# Patient Record
Sex: Female | Born: 1965 | Race: White | Hispanic: No | Marital: Married | Smoking: Never smoker
Health system: Southern US, Community
[De-identification: ages and names within clinical notes are randomized; demographics above are authoritative.]

## PROBLEM LIST (undated history)

## (undated) DIAGNOSIS — J45909 Unspecified asthma, uncomplicated: Secondary | ICD-10-CM

## (undated) DIAGNOSIS — K219 Gastro-esophageal reflux disease without esophagitis: Secondary | ICD-10-CM

## (undated) DIAGNOSIS — A0472 Enterocolitis due to Clostridium difficile, not specified as recurrent: Secondary | ICD-10-CM

## (undated) DIAGNOSIS — F32A Depression, unspecified: Secondary | ICD-10-CM

## (undated) DIAGNOSIS — F329 Major depressive disorder, single episode, unspecified: Secondary | ICD-10-CM

## (undated) HISTORY — PX: CHOLECYSTECTOMY: SHX55

## (undated) HISTORY — PX: FOOT FASCIOTOMY: SHX1659

## (undated) HISTORY — DX: Depression, unspecified: F32.A

## (undated) HISTORY — DX: Unspecified asthma, uncomplicated: J45.909

## (undated) HISTORY — DX: Enterocolitis due to Clostridium difficile, not specified as recurrent: A04.72

---
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
## 2000-04-06 ENCOUNTER — Other Ambulatory Visit: Admission: RE | Admit: 2000-04-06 | Discharge: 2000-04-06 | Payer: Self-pay | Admitting: Gynecology

## 2002-06-26 ENCOUNTER — Other Ambulatory Visit: Admission: RE | Admit: 2002-06-26 | Discharge: 2002-06-26 | Payer: Self-pay | Admitting: Gynecology

## 2002-07-16 ENCOUNTER — Encounter: Admission: RE | Admit: 2002-07-16 | Discharge: 2002-07-16 | Payer: Self-pay | Admitting: Gynecology

## 2002-07-16 ENCOUNTER — Encounter: Payer: Self-pay | Admitting: Gynecology

## 2003-07-22 ENCOUNTER — Other Ambulatory Visit: Admission: RE | Admit: 2003-07-22 | Discharge: 2003-07-22 | Payer: Self-pay | Admitting: Gynecology

## 2003-10-13 ENCOUNTER — Other Ambulatory Visit: Admission: RE | Admit: 2003-10-13 | Discharge: 2003-10-13 | Payer: Self-pay | Admitting: Obstetrics and Gynecology

## 2004-04-19 ENCOUNTER — Inpatient Hospital Stay (HOSPITAL_COMMUNITY): Admission: AD | Admit: 2004-04-19 | Discharge: 2004-04-22 | Payer: Self-pay | Admitting: Obstetrics and Gynecology

## 2004-06-10 ENCOUNTER — Other Ambulatory Visit: Admission: RE | Admit: 2004-06-10 | Discharge: 2004-06-10 | Payer: Self-pay | Admitting: Obstetrics and Gynecology

## 2005-07-21 ENCOUNTER — Other Ambulatory Visit: Admission: RE | Admit: 2005-07-21 | Discharge: 2005-07-21 | Payer: Self-pay | Admitting: Gynecology

## 2006-07-02 ENCOUNTER — Inpatient Hospital Stay (HOSPITAL_COMMUNITY): Admission: AD | Admit: 2006-07-02 | Discharge: 2006-07-05 | Payer: Self-pay | Admitting: Obstetrics and Gynecology

## 2007-03-14 ENCOUNTER — Encounter: Admission: RE | Admit: 2007-03-14 | Discharge: 2007-03-14 | Payer: Self-pay | Admitting: Obstetrics and Gynecology

## 2007-03-14 IMAGING — MG MM DIGITAL SCREENING
4 series · 4 of 4 positions shown · non-contrast
Comparison: Prior studies.

DG SCREEN MAMMOGRAM BILATERAL
Bilateral CC and MLO view(s) were taken.
Technologist: FEI
Prior study comparison: [DATE], bilateral screening mammogram, performed at The [REDACTED].

DIGITAL SCREENING MAMMOGRAM WITH CAD:

[R CC]
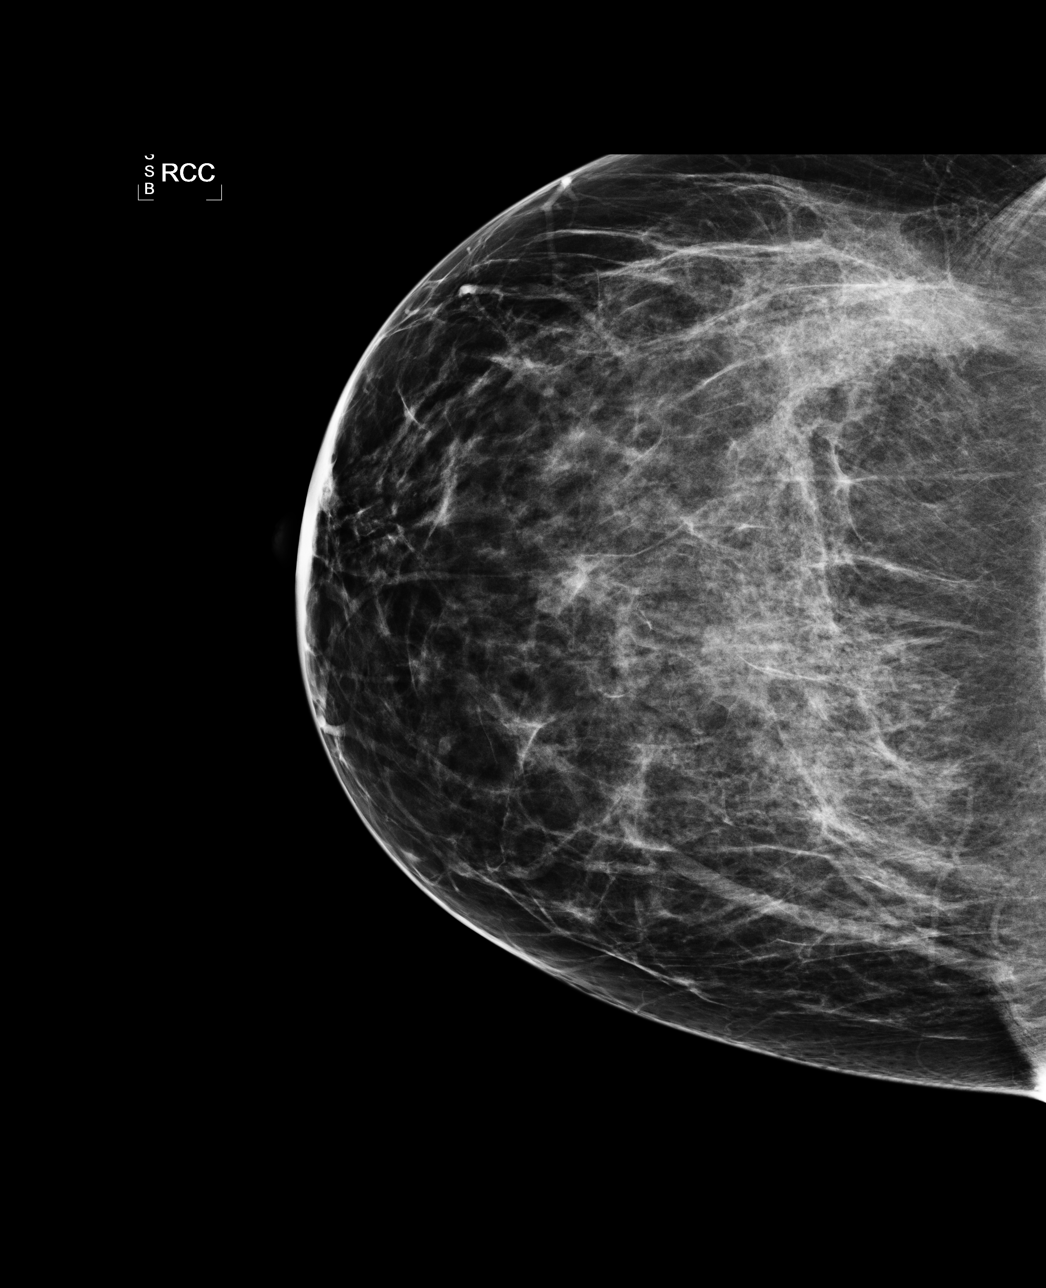

[L CC]
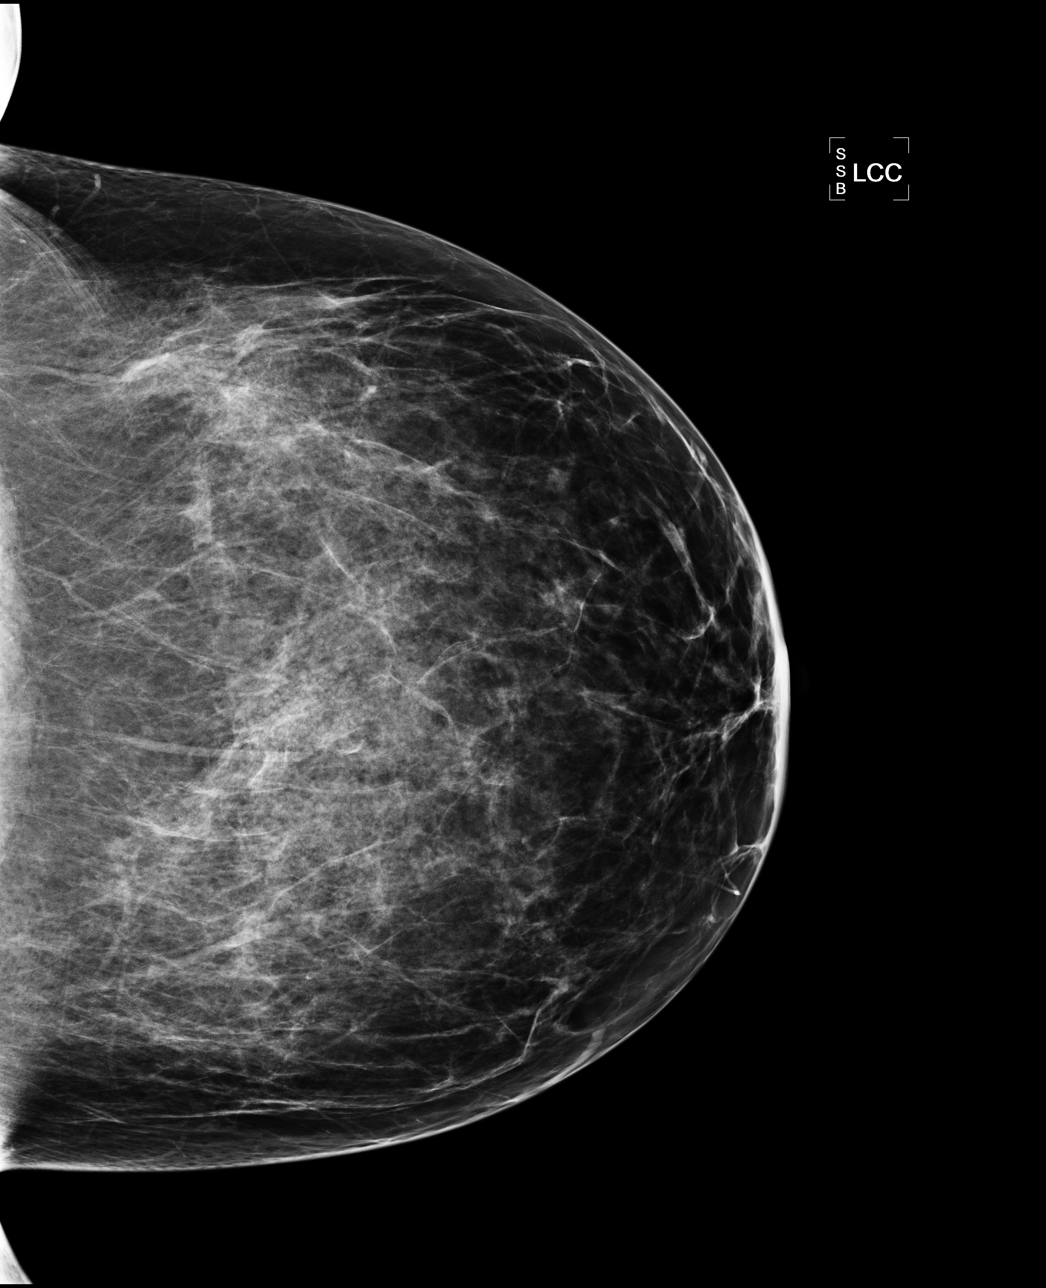

[L MLO]
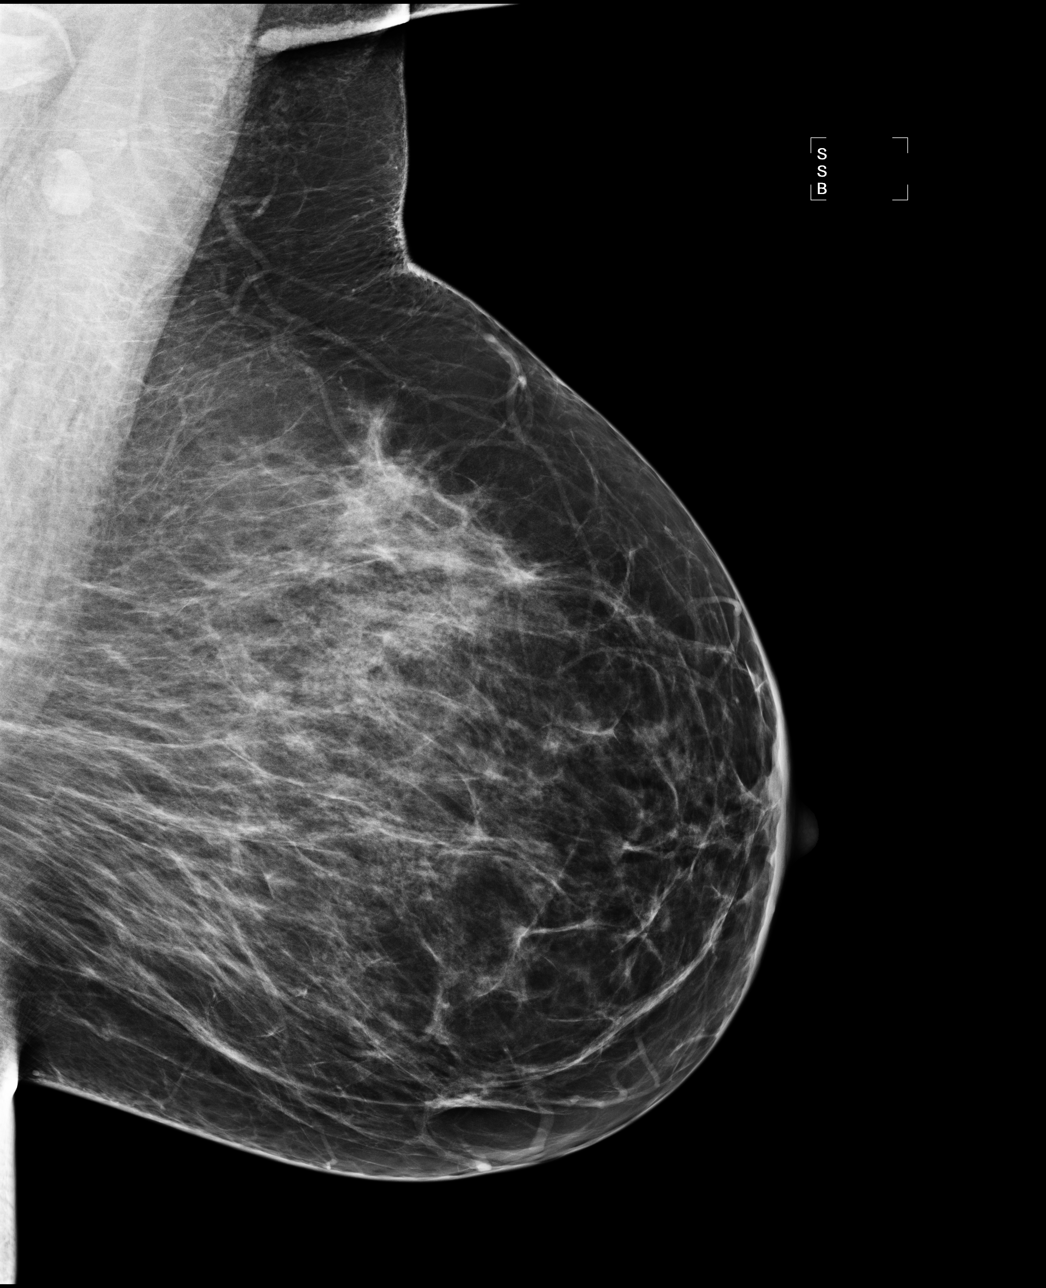

[R MLO]
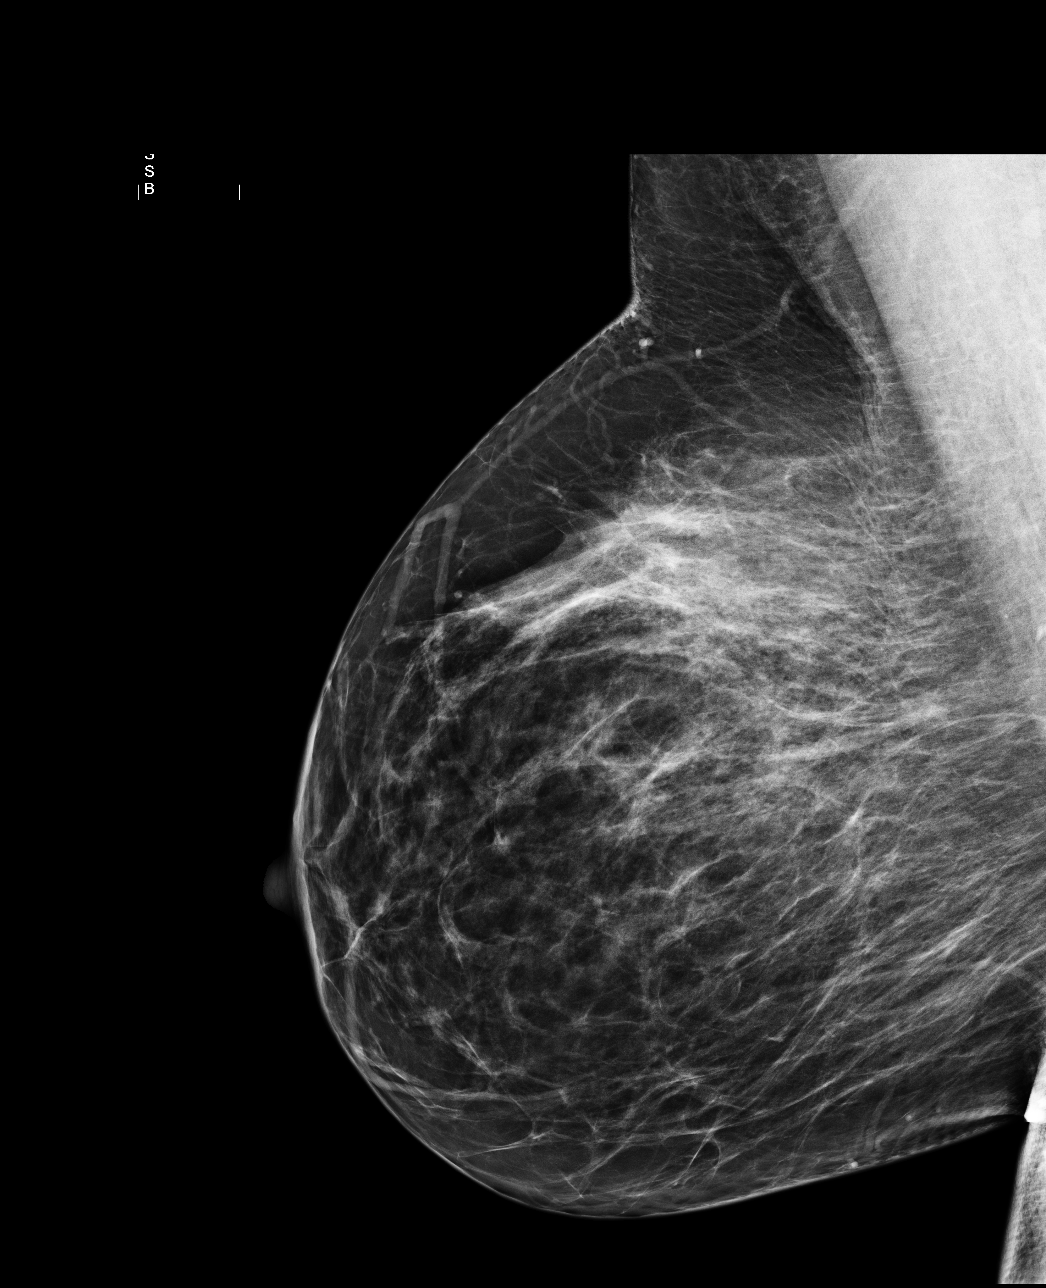

[4 of 4 positions shown; findings below may reference images not displayed]

There are scattered fibroglandular densities.  There is no dominant mass, architectural distortion 
or calcification to suggest malignancy.
IMPRESSION: No mammographic evidence of malignancy.  Suggest yearly screening mammography.

ASSESSMENT: Negative - BI-RADS 1

Screening mammogram of both breasts in 1 year.
THIS WAS ANALAYZED BY COMPUTER AIDED DETECTION. , THIS PROCEDURE WAS A DIGITAL MAMMOGRAM.

## 2008-06-30 ENCOUNTER — Ambulatory Visit: Payer: Self-pay | Admitting: Occupational Medicine

## 2009-06-08 ENCOUNTER — Encounter (INDEPENDENT_AMBULATORY_CARE_PROVIDER_SITE_OTHER): Payer: Self-pay

## 2009-06-09 ENCOUNTER — Ambulatory Visit: Payer: Self-pay | Admitting: Gastroenterology

## 2009-06-11 ENCOUNTER — Telehealth: Payer: Self-pay | Admitting: Gastroenterology

## 2009-06-23 ENCOUNTER — Ambulatory Visit: Payer: Self-pay | Admitting: Gastroenterology

## 2009-06-23 HISTORY — PX: COLONOSCOPY: SHX174

## 2010-05-25 NOTE — Miscellaneous (Signed)
Summary: Lec previsit  Clinical Lists Changes  Medications: Added new medication of MOVIPREP 100 GM  SOLR (PEG-KCL-NACL-NASULF-NA ASC-C) As per prep instructions. - Signed Rx of MOVIPREP 100 GM  SOLR (PEG-KCL-NACL-NASULF-NA ASC-C) As per prep instructions.;  #1 x 0;  Signed;  Entered by: Ulis Rias RN;  Authorized by: Louis Meckel MD;  Method used: Electronically to [facility]*, [address] [place], [place]  [address], Ph: [phone], Fax: [phone] Observations: Added new observation of ALLERGY REV: Done (06/09/2009 12:47)    Prescriptions: MOVIPREP 100 GM  SOLR (PEG-KCL-NACL-NASULF-NA ASC-C) As per prep instructions.  #1 x 0   Entered by:   Ulis Rias RN   Authorized by:   Louis Meckel MD   Signed by:   Ulis Rias RN on 06/09/2009   Method used:   Electronically to        [facility]* (retail)       [address] [place], [place]  [address]       Ph: [phone]       Fax: [phone]   RxID:   [id number]

## 2010-05-25 NOTE — Letter (Signed)
Summary: [facility] Instructions  [facility]  [address] [place], [place] [address]   Phone: [phone]  Fax: [phone]       Kristina White    [birth year]    MRN: [id number]        Procedure Day /Date:  06/23/09     Arrival Time: 12:30pm     Procedure Time:  1:30pm     Location of Procedure:                    _ x_  [facility] (4th Floor)                        PREPARATION FOR COLONOSCOPY WITH MOVIPREP   Starting 5 days prior to your procedure _ 2/24/11_ do not eat nuts, seeds, popcorn, corn, beans, peas,  salads, or any raw vegetables.  Do not take any fiber supplements (e.g. Metamucil, Citrucel, and Benefiber).  THE DAY BEFORE YOUR PROCEDURE         DATE:  06/22/09   DAY:   Monday  1.  Drink clear liquids the entire day-NO SOLID FOOD  2.  Do not drink anything colored red or purple.  Avoid juices with pulp.  No orange juice.  3.  Drink at least 64 oz. (8 glasses) of fluid/clear liquids during the day to prevent dehydration and help the prep work efficiently.  CLEAR LIQUIDS INCLUDE: Water Jello Ice Popsicles Tea (sugar ok, no milk/cream) Powdered fruit flavored drinks Coffee (sugar ok, no milk/cream) Gatorade Juice: apple, white grape, white cranberry  Lemonade Clear bullion, consomm, broth Carbonated beverages (any kind) Strained chicken noodle soup Hard Candy                             4.  In the morning, mix first dose of MoviPrep solution:    Empty 1 Pouch A and 1 Pouch B into the disposable container    Add lukewarm drinking water to the top line of the container. Mix to dissolve    Refrigerate (mixed solution should be used within 24 hrs)  5.  Begin drinking the prep at 5:00 p.m. The MoviPrep container is divided by 4 marks.   Every 15 minutes drink the solution down to the next mark (approximately 8 oz) until the full liter is complete.   6.  Follow completed prep with 16 oz of clear liquid of your choice  (Nothing red or purple).  Continue to drink clear liquids until bedtime.  7.  Before going to bed, mix second dose of MoviPrep solution:    Empty 1 Pouch A and 1 Pouch B into the disposable container    Add lukewarm drinking water to the top line of the container. Mix to dissolve    Refrigerate  THE DAY OF YOUR PROCEDURE      DATE:  06/23/09  DAY:  Tuesday  Beginning at  8:30am  (5 hours before procedure):         1. Every 15 minutes, drink the solution down to the next mark (approx 8 oz) until the full liter is complete.  2. Follow completed prep with 16 oz. of clear liquid of your choice.    3. You may drink clear liquids until  11:30am  (2 HOURS BEFORE PROCEDURE).   MEDICATION INSTRUCTIONS  Unless otherwise instructed, you should take regular prescription medications with a small sip of water  as early as possible the morning of your procedure.         OTHER INSTRUCTIONS  You will need a responsible adult at least 44 years of age to accompany you and drive you home.   This person must remain in the waiting room during your procedure.  Wear loose fitting clothing that is easily removed.  Leave jewelry and other valuables at home.  However, you may wish to bring a book to read or  an iPod/MP3 player to listen to music as you wait for your procedure to start.  Remove all body piercing jewelry and leave at home.  Total time from sign-in until discharge is approximately 2-3 hours.  You should go home directly after your procedure and rest.  You can resume normal activities the  day after your procedure.  The day of your procedure you should not:   Drive   Make legal decisions   Operate machinery   Drink alcohol   Return to work  You will receive specific instructions about eating, activities and medications before you leave.    The above instructions have been reviewed and explained to me by   Ulis Rias RN  June 09, 2009 1:01 PM    I fully  understand and can verbalize these instructions _____________________________ Date _________

## 2010-05-25 NOTE — Progress Notes (Signed)
Summary: triage  Phone Note From Pharmacy   Caller: [facility] [address] Summary of Call: Patient does not want to pay 50.00 for Moviprep.  She wants to know if she can have cheaper prep-Miralax???  Can you call [facility] at [phone] and advise.  Thanks.....  I tried to call patient to see if she can pick up 20.00 rebate coupon but no answer.   Initial call taken by: Milford Cage [place],  June 11, 2009 3:04 PM  Follow-up for Phone Call        Message left for pt. to pick-up rebate coupon. Pt. instructed to call back as needed.  Follow-up by: Laureen Ochs LPN,  June 11, 2009 3:28 PM

## 2010-05-25 NOTE — Procedures (Signed)
Summary: Colonoscopy  Patient: Kristina White Note: All result statuses are Final unless otherwise noted.  Tests: (1) Colonoscopy (COL)   COL Colonoscopy           DONE     [facility]     [address]     [place], [place]  [address]           COLONOSCOPY PROCEDURE REPORT           PATIENT:  Kristina, White  MR#:  [id number]     BIRTHDATE:  1966/01/20, 44 yrs. old  GENDER:  female           ENDOSCOPIST:  Barbette Hair. Arlyce Dice, MD     Referred by:           PROCEDURE DATE:  06/23/2009     PROCEDURE:  Colonoscopy, Diagnostic     ASA CLASS:  Class II     INDICATIONS:  Routine Risk Screening, family Hx of polyps, family     history of colon cancer           MEDICATIONS:   Fentanyl 100 mcg IV, Versed 9 mg IV           DESCRIPTION OF PROCEDURE:   After the risks benefits and     alternatives of the procedure were thoroughly explained, informed     consent was obtained.  Digital rectal exam was performed and     revealed no abnormalities.   The LB CF-[id number] endoscope     was introduced through the anus and advanced to the cecum, which     was identified by both the appendix and ileocecal valve, without     limitations.  The quality of the prep was good, using MoviPrep.     The instrument was then slowly withdrawn as the colon was fully     examined.     <<PROCEDUREIMAGES>>           FINDINGS:  A normal appearing cecum, ileocecal valve, and     appendiceal orifice were identified. The ascending, hepatic     flexure, transverse, splenic flexure, descending, sigmoid colon,     and rectum appeared unremarkable (see image2, image3, image4,     image6, image7, image9, image10, image11, and image12).     Retroflexed views in the rectum revealed no abnormalities.    The     scope was then withdrawn from the patient and the procedure     completed.           COMPLICATIONS:  None           ENDOSCOPIC IMPRESSION:     1) Normal colon     RECOMMENDATIONS:     1)  colonoscopy in 10 years           REPEAT EXAM:  In 10 year(s) for Colonoscopy.           ______________________________     Barbette Hair. Arlyce Dice, MD           CC:  Beather Arbour, MD           n.     Rosalie Doctor:   Barbette Hair. Kaplan at 06/23/2009 02:14 PM           Ambrose Pancoast, [id number]  Note: An exclamation mark (!) indicates a result that was not dispersed into the flowsheet. Document Creation Date: 06/23/2009 2:14 PM _______________________________________________________________________  (1) Order result status: Final Collection or observation date-time: 06/23/2009 14:09 Requested  date-time:  Receipt date-time:  Reported date-time:  Referring Physician:   Ordering Physician: Melvia Heaps [id number]) Specimen Source:  Source: Launa Grill Order Number: [id number] Lab site:   Appended Document: Colonoscopy    Clinical Lists Changes  Observations: Added new observation of COLONNXTDUE: 06/2019 (06/23/2009 14:59)

## 2010-11-05 ENCOUNTER — Other Ambulatory Visit: Payer: Self-pay | Admitting: Gynecology

## 2010-11-05 DIAGNOSIS — Z1231 Encounter for screening mammogram for malignant neoplasm of breast: Secondary | ICD-10-CM

## 2010-11-09 ENCOUNTER — Ambulatory Visit
Admission: RE | Admit: 2010-11-09 | Discharge: 2010-11-09 | Disposition: A | Discharge status: Home / Self Care | Payer: Self-pay | Source: Ambulatory Visit | Attending: Gynecology | Admitting: Gynecology

## 2010-11-09 DIAGNOSIS — Z1231 Encounter for screening mammogram for malignant neoplasm of breast: Secondary | ICD-10-CM

## 2011-11-16 ENCOUNTER — Other Ambulatory Visit: Payer: Self-pay | Admitting: Gynecology

## 2011-11-16 DIAGNOSIS — Z9289 Personal history of other medical treatment: Secondary | ICD-10-CM

## 2011-11-22 ENCOUNTER — Ambulatory Visit: Payer: BC Managed Care – PPO

## 2011-11-29 ENCOUNTER — Ambulatory Visit: Payer: BC Managed Care – PPO

## 2012-01-03 ENCOUNTER — Ambulatory Visit (INDEPENDENT_AMBULATORY_CARE_PROVIDER_SITE_OTHER): Payer: BC Managed Care – PPO

## 2012-01-03 DIAGNOSIS — Z1231 Encounter for screening mammogram for malignant neoplasm of breast: Secondary | ICD-10-CM

## 2013-11-29 ENCOUNTER — Encounter: Payer: Self-pay | Admitting: Gastroenterology

## 2014-01-06 DIAGNOSIS — B001 Herpesviral vesicular dermatitis: Secondary | ICD-10-CM | POA: Insufficient documentation

## 2014-01-06 DIAGNOSIS — S73191A Other sprain of right hip, initial encounter: Secondary | ICD-10-CM | POA: Insufficient documentation

## 2014-01-06 DIAGNOSIS — F329 Major depressive disorder, single episode, unspecified: Secondary | ICD-10-CM | POA: Insufficient documentation

## 2014-01-06 DIAGNOSIS — M5136 Other intervertebral disc degeneration, lumbar region: Secondary | ICD-10-CM | POA: Insufficient documentation

## 2017-06-16 DIAGNOSIS — M79642 Pain in left hand: Secondary | ICD-10-CM | POA: Insufficient documentation

## 2017-12-27 DIAGNOSIS — M159 Polyosteoarthritis, unspecified: Secondary | ICD-10-CM | POA: Insufficient documentation

## 2018-09-21 ENCOUNTER — Other Ambulatory Visit: Payer: Self-pay | Admitting: Obstetrics and Gynecology

## 2018-09-21 DIAGNOSIS — Z803 Family history of malignant neoplasm of breast: Secondary | ICD-10-CM

## 2018-09-21 DIAGNOSIS — Z9189 Other specified personal risk factors, not elsewhere classified: Secondary | ICD-10-CM

## 2018-10-05 ENCOUNTER — Encounter: Payer: Self-pay | Admitting: Obstetrics and Gynecology

## 2019-05-29 ENCOUNTER — Encounter: Payer: Self-pay | Admitting: Gastroenterology

## 2019-06-11 ENCOUNTER — Other Ambulatory Visit: Payer: Self-pay

## 2019-06-11 ENCOUNTER — Ambulatory Visit (AMBULATORY_SURGERY_CENTER): Payer: BC Managed Care – PPO | Admitting: *Deleted

## 2019-06-11 VITALS — Temp 97.3°F | Ht 62.5 in | Wt 192.0 lb

## 2019-06-11 DIAGNOSIS — Z1211 Encounter for screening for malignant neoplasm of colon: Secondary | ICD-10-CM

## 2019-06-11 HISTORY — PX: CERVICAL BIOPSY: SHX590

## 2019-06-11 MED ORDER — NA SULFATE-K SULFATE-MG SULF 17.5-3.13-1.6 GM/177ML PO SOLN: ORAL | 0 refills | Status: DC

## 2019-06-11 NOTE — Progress Notes (Signed)
Patient is here in-person for PV. Patient denies any allergies to eggs or soy. Patient denies any problems with anesthesia/sedation. Patient denies any oxygen use at home. Patient denies taking any diet/weight loss medications or blood thinners. Patient is not being treated for MRSA or C-diff. EMMI education assisgned to the patient for the procedure, this was explained and instructions given to patient. COVID-19 screening test not needed patient had + covid on 04/22/2019.   Pt is having freq. Daily dark Soft stools, since covid +. Pt is aware that care partner will wait in the car during procedure; if they feel like they will be too hot or cold to wait in the car; they may wait in the 4 th floor lobby. Patient is aware to bring only one care partner. We want them to wear a mask (we do not have any that we can provide them), practice social distancing, and we will check their temperatures when they get here.  I did remind the patient that their care partner needs to stay in the parking lot the entire time and have a cell phone available, we will call them when the pt is ready for discharge. Patient will wear mask into building.    Suprep $15 off coupon given to the patient.

## 2019-06-21 ENCOUNTER — Encounter: Payer: Self-pay | Admitting: Gastroenterology

## 2019-06-25 ENCOUNTER — Encounter: Payer: Self-pay | Admitting: Gastroenterology

## 2019-06-25 ENCOUNTER — Other Ambulatory Visit: Payer: Self-pay

## 2019-06-25 ENCOUNTER — Ambulatory Visit (AMBULATORY_SURGERY_CENTER): Payer: BC Managed Care – PPO | Admitting: Gastroenterology

## 2019-06-25 VITALS — BP 147/85 | HR 66 | Temp 96.9°F | Resp 15 | Ht 62.5 in | Wt 192.0 lb

## 2019-06-25 DIAGNOSIS — Z1211 Encounter for screening for malignant neoplasm of colon: Secondary | ICD-10-CM | POA: Diagnosis present

## 2019-06-25 DIAGNOSIS — D120 Benign neoplasm of cecum: Secondary | ICD-10-CM

## 2019-06-25 DIAGNOSIS — D128 Benign neoplasm of rectum: Secondary | ICD-10-CM | POA: Diagnosis not present

## 2019-06-25 DIAGNOSIS — D125 Benign neoplasm of sigmoid colon: Secondary | ICD-10-CM | POA: Diagnosis not present

## 2019-06-25 MED ORDER — SODIUM CHLORIDE 0.9 % IV SOLN: 500.0000 mL | Freq: Once | INTRAVENOUS | Status: DC

## 2019-06-25 NOTE — Progress Notes (Signed)
pt tolerated well. VSS. awake and to recovery. Report given to RN.  

## 2019-06-25 NOTE — Progress Notes (Signed)
Called to room to assist during endoscopic procedure.  Patient ID and intended procedure confirmed with present staff. Received instructions for my participation in the procedure from the performing physician.  

## 2019-06-25 NOTE — Progress Notes (Signed)
Vs by Shanon Payor front desk [facility]  Pt's states no medical or surgical changes since previsit or office visit.

## 2019-06-25 NOTE — Patient Instructions (Signed)
Handouts Provided:  Polyps, Diverticulosis, and Hemorrhoids  YOU HAD AN ENDOSCOPIC PROCEDURE TODAY AT [facility]:   Refer to the procedure report that was given to you for any specific questions about what was found during the examination.  If the procedure report does not answer your questions, please call your gastroenterologist to clarify.  If you requested that your care partner not be given the details of your procedure findings, then the procedure report has been included in a sealed envelope for you to review at your convenience later.  YOU SHOULD EXPECT: Some feelings of bloating in the abdomen. Passage of more gas than usual.  Walking can help get rid of the air that was put into your GI tract during the procedure and reduce the bloating. If you had a lower endoscopy (such as a colonoscopy or flexible sigmoidoscopy) you may notice spotting of blood in your stool or on the toilet paper. If you underwent a bowel prep for your procedure, you may not have a normal bowel movement for a few days.  Please Note:  You might notice some irritation and congestion in your nose or some drainage.  This is from the oxygen used during your procedure.  There is no need for concern and it should clear up in a day or so.  SYMPTOMS TO REPORT IMMEDIATELY:   Following lower endoscopy (colonoscopy or flexible sigmoidoscopy):  Excessive amounts of blood in the stool  Significant tenderness or worsening of abdominal pains  Swelling of the abdomen that is new, acute  Fever of 100F or higher  For urgent or emergent issues, a gastroenterologist can be reached at any hour by calling [phone].   DIET:  We do recommend a small meal at first, but then you may proceed to your regular diet.  Drink plenty of fluids but you should avoid alcoholic beverages for 24 hours.  ACTIVITY:  You should plan to take it easy for the rest of today and you should NOT DRIVE or use heavy machinery until tomorrow  (because of the sedation medicines used during the test).    FOLLOW UP: Our staff will call the number listed on your records 48-72 hours following your procedure to check on you and address any questions or concerns that you may have regarding the information given to you following your procedure. If we do not reach you, we will leave a message.  We will attempt to reach you two times.  During this call, we will ask if you have developed any symptoms of COVID 19. If you develop any symptoms (ie: fever, flu-like symptoms, shortness of breath, cough etc.) before then, please call [phone].  If you test positive for Covid 19 in the 2 weeks post procedure, please call and report this information to [place].    If any biopsies were taken you will be contacted by phone or by letter within the next 1-3 weeks.  Please call us at [phone] if you have not heard about the biopsies in 3 weeks.    SIGNATURES/CONFIDENTIALITY: You and/or your care partner have signed paperwork which will be entered into your electronic medical record.  These signatures attest to the fact that that the information above on your After Visit Summary has been reviewed and is understood.  Full responsibility of the confidentiality of this discharge information lies with you and/or your care-partner.

## 2019-06-25 NOTE — Op Note (Signed)
[facility] Patient Name: Kristina White Procedure Date: 06/25/2019 9:10 AM MRN: [id number] Endoscopist: Mauri Pole , MD Age: 54 Referring MD:  Date of Birth: 01/11/1966 Gender: Female Account #: [id number] Procedure:                Colonoscopy Indications:              Screening for colorectal malignant neoplasm.                            Grandparent with colon cancer and father had colon                            polyps. Medicines:                Monitored Anesthesia Care Procedure:                Pre-Anesthesia Assessment:                           - Prior to the procedure, a History and Physical                            was performed, and patient medications and                            allergies were reviewed. The patient's tolerance of                            previous anesthesia was also reviewed. The risks                            and benefits of the procedure and the sedation                            options and risks were discussed with the patient.                            All questions were answered, and informed consent                            was obtained. Prior Anticoagulants: The patient has                            taken no previous anticoagulant or antiplatelet                            agents. ASA Grade Assessment: II - A patient with                            mild systemic disease. After reviewing the risks                            and benefits, the patient was deemed in  satisfactory condition to undergo the procedure.                           After obtaining informed consent, the colonoscope                            was passed under direct vision. Throughout the                            procedure, the patient's blood pressure, pulse, and                            oxygen saturations were monitored continuously. The                            Colonoscope was introduced through the anus and                             advanced to the the cecum, identified by                            appendiceal orifice and ileocecal valve. The                            colonoscopy was performed without difficulty. The                            patient tolerated the procedure well. The quality                            of the bowel preparation was excellent. The                            ileocecal valve, appendiceal orifice, and rectum                            were photographed. Scope In: 9:14:30 AM Scope Out: 9:34:34 AM Scope Withdrawal Time: 0 hours 14 minutes 23 seconds  Total Procedure Duration: 0 hours 20 minutes 4 seconds  Findings:                 The perianal and digital rectal examinations were                            normal.                           A 4 mm polyp was found in the sigmoid colon. The                            polyp was sessile. The polyp was removed with a                            cold snare. Resection and retrieval were complete.  Three sessile polyps were found in the rectum and                            cecum. The polyps were 1 to 2 mm in size. These                            polyps were removed with a cold biopsy forceps.                            Resection and retrieval were complete.                           A few small-mouthed diverticula were found in the                            sigmoid colon.                           Non-bleeding internal hemorrhoids were found during                            retroflexion. The hemorrhoids were small.                           The exam was otherwise without abnormality. Complications:            No immediate complications. Estimated Blood Loss:     Estimated blood loss was minimal. Impression:               - One 4 mm polyp in the sigmoid colon, removed with                            a cold snare. Resected and retrieved.                           - Three 1 to 2 mm polyps in  the rectum and in the                            cecum, removed with a cold biopsy forceps. Resected                            and retrieved.                           - Diverticulosis in the sigmoid colon.                           - Non-bleeding internal hemorrhoids.                           - The examination was otherwise normal. Recommendation:           - Patient has a contact number available for  emergencies. The signs and symptoms of potential                            delayed complications were discussed with the                            patient. Return to normal activities tomorrow.                            Written discharge instructions were provided to the                            patient.                           - Resume previous diet.                           - Continue present medications.                           - Await pathology results.                           - Repeat colonoscopy in 3 - 10 years for                            surveillance based on pathology results. Mauri Pole, MD 06/25/2019 9:41:40 AM This report has been signed electronically.

## 2019-06-27 ENCOUNTER — Telehealth: Payer: Self-pay

## 2019-06-27 ENCOUNTER — Encounter: Payer: Self-pay | Admitting: Gastroenterology

## 2019-06-27 NOTE — Telephone Encounter (Signed)
Left message on follow up call. 

## 2019-06-27 NOTE — Telephone Encounter (Signed)
  Follow up Call-  Call back number 06/25/2019  Post procedure Call Back phone  # [phone]  Permission to leave phone message Yes  Some recent data might be hidden     Patient questions:  Do you have a fever, pain , or abdominal swelling? No. Pain Score  0 *  Have you tolerated food without any problems? Yes.    Have you been able to return to your normal activities? Yes.    Do you have any questions about your discharge instructions: Diet   No. Medications  No. Follow up visit  No.  Do you have questions or concerns about your Care? No.  Actions: * If pain score is 4 or above: No action needed, pain <4. 1. Have you developed a fever since your procedure? no  2.   Have you had an respiratory symptoms (SOB or cough) since your procedure? no  3.   Have you tested positive for COVID 19 since your procedure no  4.   Have you had any family members/close contacts diagnosed with the COVID 19 since your procedure?  no   If yes to any of these questions please route to Joylene John, RN and Alphonsa Gin, Therapist, sports.

## 2020-02-03 ENCOUNTER — Other Ambulatory Visit: Payer: Self-pay | Admitting: Obstetrics and Gynecology

## 2020-02-03 DIAGNOSIS — Z9189 Other specified personal risk factors, not elsewhere classified: Secondary | ICD-10-CM

## 2023-07-03 ENCOUNTER — Other Ambulatory Visit: Payer: Self-pay | Admitting: Urology

## 2023-07-18 ENCOUNTER — Encounter (HOSPITAL_COMMUNITY): Payer: Self-pay | Admitting: Urology

## 2023-07-18 NOTE — Progress Notes (Signed)
 Spoke w/ via phone for pre-op interview--- Kristina White needs dos----    NONE     White results------ COVID test -----patient states asymptomatic no test needed Arrive at -------1215 NPO after MN NO Solid Food.  Clear liquids from MN until---1115 Pre-Surgery Ensure or G2:  Med rec completed Medications to take morning of surgery ----- Protonix, Zoloft and Valtrex PRN Diabetic medication -----  GLP1 agonist last dose:Semaglutide 1.75-3/17/25. GLP1 instructions: Hold any further doses until after surgery, pt verbalized understanding.  Patient instructed no nail polish to be worn day of surgery Patient instructed to bring photo id and insurance card day of surgery Patient aware to have Driver (ride ) / caregiver    for 24 hours after surgery - Husband Kristina White Patient Special Instructions ----- Shower with antibacterial soap. Pre-Op special Instructions -----  Patient verbalized understanding of instructions that were given at this phone interview. Patient denies chest pain, sob, fever, cough at the interview.

## 2023-07-21 NOTE — H&P (Signed)
 I was consulted to assess the patient's urinary incontinence. She was referred by one of my partners. She leaks with coughing sneezing bending and lifting. She leaks with activity. Sometimes she has urge incontinence. No bedwetting. She wears 4 pads a day that are damp   She voids every 2-3 hours and gets up once at night and reports a good flow   She has not had a hysterectomy   On pelvic examination patient had grade 2 hypermobility of the bladder neck and negative cough test. She had a mild suburethral swelling and in my opinion not a urethral diverticulum. No tenderness. She did have rotational descent with spring back of the urethra and bladder. No significant prolapse.   Patient has mixed iincontinence and primarily stress incontinence. Role of physical therapy versus urodynamics discussed. If she has a sling reason for referral discussed. She agreed with the plan. She avoid sports like pickleball because of her stress incontinence and is likely going to wish to have a sling as previously discussed by Dr. Cardell Peach. I will assess her post urodynamics with cystoscopy to assess her urethra and bladder.   Today  Frequency stable. Incontinence stable.  On pelvic examination patient had mild grade 2 hypermobility the bladder neck and a mild positive cough test after cystoscopy  Cystoscopy normal  On urodynamics patient voided 322 mL. Maxim flow was approximately 12 mL/s. Residual was a few milliliters. Maxim bladder capacity was 351 mL. She had mild increased bladder sensation. Bladder was stable. Her cough leak point pressure 200 mL was 51 cmH2O with moderate to severe leakage. Her cough leak point pressure 300 mL was 83 cmH2O with severe leakage. Her Valsalva leak point pressure at 300 mL was 20 cm of water. During voluntary voiding the patient voided 351 mL. She had interrupted pattern. Maximum flow was approximately 12 mL/s. Maxim voiding pressure approximately 17 cm of water. EMG activity normal.  Residual 0 mL. Bladder neck distended 1 to 2 cm the details of the urodynamics are signed dictated.   Picture was drawn. Physical therapy medication discussed. Bulking agent discussed in full detail. Sling discussed in full detail. Referral discussed. She says she is with dental work and with local anesthetic she has to have extra anesthesia and even laughing gas because it often does not work well. For this reason she would like to have it under anesthesia a bulking agent and I agree with her. We will proceed according. Ciprofloxacin prescription sent in. Patient is willing to go to a sling if bulking agent is not successful. Retreatment rates discussed. Rare risk of erosion and pain discussed and rare risk of retention discussed long-term       ALLERGIES: Doxycycline Erythromycin    MEDICATIONS: Dapsone 5 % gel  Mobic  Protonix  Semaglutide  Zoloft     GU PSH: Complex cystometrogram, w/ void pressure and urethral pressure profile studies, any technique - 06/26/2023 Complex Uroflow - 06/26/2023 Emg surf Electrd - 06/26/2023 Inject For cystogram - 06/26/2023 Intrabd voidng Press - 06/26/2023       PSH Notes: foot surgery, fascia releases    NON-GU PSH: Cholecystectomy (open) Visit Complexity (formerly GPC1X) - 05/31/2023     GU PMH: Mixed incontinence - 06/26/2023, - 05/31/2023 Urinary Urgency - 06/26/2023, - 01/13/2023, - 07/05/2022 Nocturia - 05/31/2023 Stress Incontinence - 01/13/2023, - 07/05/2022 Renal calculus    NON-GU PMH: Asthma Depression GERD Hypertension    FAMILY HISTORY: Kidney Failure - Father Kidney Stones - Mother Prostate Cancer - Grandfather  SOCIAL HISTORY: Marital Status: Married Preferred Language: English; Ethnicity: Not Hispanic Or Latino; Race: White Current Smoking Status: Patient has never smoked.   Tobacco Use Assessment Completed: Used Tobacco in last 30 days? Social Drinker.  Drinks 2 caffeinated drinks per day.    REVIEW OF SYSTEMS:    GU Review  Female:   Patient reports hard to postpone urination, get up at night to urinate, and leakage of urine. Patient denies frequent urination, burning /pain with urination, stream starts and stops, trouble starting your stream, have to strain to urinate, and being pregnant.  Gastrointestinal (Upper):   Patient denies nausea, vomiting, and indigestion/ heartburn.  Gastrointestinal (Lower):   Patient denies diarrhea and constipation.  Constitutional:   Patient denies fever, night sweats, weight loss, and fatigue.  Skin:   Patient denies skin rash/ lesion and itching.  Eyes:   Patient denies blurred vision and double vision.  Ears/ Nose/ Throat:   Patient denies sore throat and sinus problems.  Hematologic/Lymphatic:   Patient denies swollen glands and easy bruising.  Cardiovascular:   Patient denies leg swelling and chest pains.  Respiratory:   Patient denies cough and shortness of breath.  Endocrine:   Patient denies excessive thirst.  Musculoskeletal:   Patient denies back pain and joint pain.  Neurological:   Patient denies headaches and dizziness.  Psychologic:   Patient denies depression and anxiety.   VITAL SIGNS: None   PAST DATA REVIEW: None   PROCEDURES:          Urinalysis w/Scope Dipstick Dipstick Cont'd Micro  Color: Yellow Bilirubin: Neg mg/dL WBC/hpf: 0 - 5/hpf  Appearance: Slightly Cloudy Ketones: Neg mg/dL RBC/hpf: 0 - 2/hpf  Specific Gravity: 1.025 Blood: Neg ery/uL Bacteria: Few (10-25/hpf)  pH: 6.5 Protein: Neg mg/dL Cystals: NS (Not Seen)  Glucose: Neg mg/dL Urobilinogen: 0.2 mg/dL Casts: NS (Not Seen)    Nitrites: Neg Trichomonas: Not Present    Leukocyte Esterase: 1+ leu/uL Mucous: Present      Epithelial Cells: 0 - 5/hpf      Yeast: NS (Not Seen)      Sperm: Not Present    ASSESSMENT:      ICD-10 Details  1 GU:   Mixed incontinence - N39.46   2   Urinary Frequency - R35.0               Notes:   We talked about a sling in detail. Pros, cons, general surgical  and anesthetic risks, and other options including behavioral therapy and watchful waiting were discussed. She understands that slings are generally successful in 90% of cases for stress incontinence, 50% for urge incontinence, and that in a small % of cases the incontinence can worsen. The risk of persistent, de novo, or worsening incontinence/dysfunction was discussed. Risks were described but not limited to the discussion of injury to neighboring structures including the bowel (with possible life-threatening sepsis and colostomy), bladder, urethra, vagina (all resulting in further surgery), and ureter (resulting in re-implantation). We also talked about the risk of retention requiring urethrolysis, extrusion requiring revision, and erosion resulting in further surgery. Bleeding risks and transfusion rates and the risk of infection were discussed. The risk of pelvic and abdominal pain syndromes, dyspareunia, and neuropathies were discussed. The need for CIC was described as well the usual post-operative course. The patient understands that she might not reach her treatment goal and that she might be worse following surgery.    The patient has stress incontinence We talked about urethral injectables  in detail. Pros, cons, general surgical and anesthetic risks, and other options including behavioral therapy, watchful waiting, and surgery were discussed. She understands that injectables are generally successful in 40-70% of cases for stress incontinence, <50% for urge incontinence, and that in a small % of cases the incontinence can worsen. Risks were described but not limited to the risk of persistent, de novo, or worsening incontinence and flow symptoms were discussed. The need for long-term CIC is possible but rare. We also talked about the risk of erosion into bladder, urethra, and/or vagina with sequelae. Bleeding, infection, pain, dysuria, dyspareunia, and urethral irritation risks were discussed. Retreatment  rates were discussed. The patient understands that she might not reach her treatment goal and that she might be worse following surgery.      PLAN:            Medications New Meds: Cipro 250 mg tablet 1 tablet PO BID   #6  0 Refill(s)  Pharmacy Name:  [facility]  Address:  [address]   [place], [place] [id number]  Phone:  [phone]  Fax:  [phone]

## 2023-07-25 ENCOUNTER — Ambulatory Visit (HOSPITAL_COMMUNITY): Admitting: Anesthesiology

## 2023-07-25 ENCOUNTER — Encounter (HOSPITAL_COMMUNITY): Payer: Self-pay | Admitting: Urology

## 2023-07-25 ENCOUNTER — Encounter (HOSPITAL_COMMUNITY)
Admission: RE | Disposition: A | Discharge status: Home / Self Care | Payer: Self-pay | Source: Home / Self Care | Attending: Urology

## 2023-07-25 ENCOUNTER — Ambulatory Visit (HOSPITAL_COMMUNITY)
Admission: RE | Admit: 2023-07-25 | Discharge: 2023-07-25 | Disposition: A | Discharge status: Home / Self Care | Attending: Urology | Admitting: Urology

## 2023-07-25 DIAGNOSIS — N3946 Mixed incontinence: Secondary | ICD-10-CM | POA: Insufficient documentation

## 2023-07-25 DIAGNOSIS — N3642 Intrinsic sphincter deficiency (ISD): Secondary | ICD-10-CM | POA: Insufficient documentation

## 2023-07-25 HISTORY — DX: Gastro-esophageal reflux disease without esophagitis: K21.9

## 2023-07-25 SURGERY — INJECTION, BULKING AGENT, URETHRA
Anesthesia: Monitor Anesthesia Care | Site: Urethra

## 2023-07-25 MED ORDER — CHLORHEXIDINE GLUCONATE 0.12 % MT SOLN
15.0000 mL | Freq: Once | OROMUCOSAL | Status: AC
  Administered 2023-07-25: 15 mL via OROMUCOSAL

## 2023-07-25 MED ORDER — ORAL CARE MOUTH RINSE: 15.0000 mL | Freq: Once | OROMUCOSAL | Status: AC

## 2023-07-25 MED ORDER — ONDANSETRON HCL 4 MG/2ML IJ SOLN
INTRAMUSCULAR | Status: DC | PRN
  Administered 2023-07-25: 4 mg via INTRAVENOUS

## 2023-07-25 MED ORDER — ACETAMINOPHEN 500 MG PO TABS
ORAL_TABLET | ORAL | Status: AC
  Filled 2023-07-25: qty 2

## 2023-07-25 MED ORDER — 0.9 % SODIUM CHLORIDE (POUR BTL) OPTIME
TOPICAL | Status: DC | PRN
  Administered 2023-07-25: 1000 mL

## 2023-07-25 MED ORDER — PROPOFOL 500 MG/50ML IV EMUL
INTRAVENOUS | Status: DC | PRN
  Administered 2023-07-25: 20 mg via INTRAVENOUS
  Administered 2023-07-25: 100 ug/kg/min via INTRAVENOUS
  Administered 2023-07-25 (×2): 20 mg via INTRAVENOUS

## 2023-07-25 MED ORDER — CHLORHEXIDINE GLUCONATE 0.12 % MT SOLN
OROMUCOSAL | Status: AC
  Filled 2023-07-25: qty 15

## 2023-07-25 MED ORDER — FENTANYL CITRATE (PF) 250 MCG/5ML IJ SOLN
INTRAMUSCULAR | Status: DC | PRN
  Administered 2023-07-25: 50 ug via INTRAVENOUS

## 2023-07-25 MED ORDER — ONDANSETRON HCL 4 MG/2ML IJ SOLN
INTRAMUSCULAR | Status: AC
  Filled 2023-07-25: qty 2

## 2023-07-25 MED ORDER — DEXMEDETOMIDINE HCL IN NACL 80 MCG/20ML IV SOLN
INTRAVENOUS | Status: AC
  Filled 2023-07-25: qty 20

## 2023-07-25 MED ORDER — SODIUM CHLORIDE 0.9 % IR SOLN
Status: DC | PRN
  Administered 2023-07-25: 3000 mL

## 2023-07-25 MED ORDER — MIDAZOLAM HCL 2 MG/2ML IJ SOLN
INTRAMUSCULAR | Status: DC | PRN
  Administered 2023-07-25: 2 mg via INTRAVENOUS

## 2023-07-25 MED ORDER — ACETAMINOPHEN 500 MG PO TABS
1000.0000 mg | ORAL_TABLET | Freq: Four times a day (QID) | ORAL | Status: DC | PRN
Start: 2023-07-25 — End: 2023-07-25
  Administered 2023-07-25: 1000 mg via ORAL

## 2023-07-25 MED ORDER — MIDAZOLAM HCL 2 MG/2ML IJ SOLN
INTRAMUSCULAR | Status: AC
  Filled 2023-07-25: qty 2

## 2023-07-25 MED ORDER — LACTATED RINGERS IV SOLN: INTRAVENOUS | Status: DC

## 2023-07-25 MED ORDER — FENTANYL CITRATE (PF) 250 MCG/5ML IJ SOLN
INTRAMUSCULAR | Status: AC
  Filled 2023-07-25: qty 5

## 2023-07-25 MED ORDER — DEXMEDETOMIDINE HCL IN NACL 80 MCG/20ML IV SOLN
INTRAVENOUS | Status: DC | PRN
  Administered 2023-07-25 (×2): 8 ug via INTRAVENOUS

## 2023-07-25 MED ORDER — LIDOCAINE HCL URETHRAL/MUCOSAL 2 % EX GEL
CUTANEOUS | Status: AC
  Filled 2023-07-25: qty 11

## 2023-07-25 SURGICAL SUPPLY — 21 items
BAG COUNTER SPONGE SURGICOUNT (BAG) ×4 IMPLANT
BAG URO CATCHER STRL LF (MISCELLANEOUS) ×2 IMPLANT
CATH ROBINSON RED A/P 14FR (CATHETERS) IMPLANT
DRAPE CAMERA CLOSED 9X96 (DRAPES) ×2 IMPLANT
ELECT REM PT RETURN 9FT ADLT (ELECTROSURGICAL) IMPLANT
ELECTRODE REM PT RTRN 9FT ADLT (ELECTROSURGICAL) ×2 IMPLANT
GLOVE BIOGEL M STRL SZ7.5 (GLOVE) ×2 IMPLANT
GOWN STRL REUS W/ TWL XL LVL3 (GOWN DISPOSABLE) ×2 IMPLANT
KIT TURNOVER KIT B (KITS) ×4 IMPLANT
MANIFOLD NEPTUNE II (INSTRUMENTS) ×2 IMPLANT
NDL 18GX1X1/2 (RX/OR ONLY) (NEEDLE) ×2 IMPLANT
NEEDLE 18GX1X1/2 (RX/OR ONLY) (NEEDLE) IMPLANT
PACK CYSTO (CUSTOM PROCEDURE TRAY) ×2 IMPLANT
PAD ARMBOARD POSITIONER FOAM (MISCELLANEOUS) ×4 IMPLANT
SET IRRIG Y TYPE TUR BLADDER L (SET/KITS/TRAYS/PACK) ×2 IMPLANT
SOL .9 NS 3000ML IRR UROMATIC (IV SOLUTION) ×2 IMPLANT
SOL PREP POV-IOD 4OZ 10% (MISCELLANEOUS) ×2 IMPLANT
SYR CONTROL 10ML LL (SYRINGE) ×2 IMPLANT
SYSTEM URETHRAL BULK BULKAMID (Female Continence) IMPLANT
TUBE CONNECTING 12X1/4 (SUCTIONS) ×2 IMPLANT
WATER STERILE IRR 3000ML UROMA (IV SOLUTION) IMPLANT

## 2023-07-25 NOTE — Anesthesia Postprocedure Evaluation (Signed)
 Anesthesia Post Note  Patient: Kristina White  Procedure(s) Performed: INJECTION, BULKING AGENT, URETHRA (Urethra)     Patient location during evaluation: PACU Anesthesia Type: MAC Level of consciousness: awake and alert Pain management: pain level controlled Vital Signs Assessment: post-procedure vital signs reviewed and stable Respiratory status: spontaneous breathing, nonlabored ventilation and respiratory function stable Cardiovascular status: blood pressure returned to baseline Postop Assessment: no apparent nausea or vomiting Anesthetic complications: no   No notable events documented.  Last Vitals:  Vitals:   07/25/23 1600 07/25/23 1615  BP: 111/69 114/61  Pulse: 72 60  Resp: 15 12  Temp: 36.5 C   SpO2: 95% 99%    Last Pain:  Vitals:   07/25/23 1600  TempSrc:   PainSc: 0-No pain                 Shanda Howells

## 2023-07-25 NOTE — Interval H&P Note (Signed)
 History and Physical Interval Note:  07/25/2023 2:55 PM  Kristina White  has presented today for surgery, with the diagnosis of INTRINSIC SPHINCTER DEFICIENCY.  The various methods of treatment have been discussed with the patient and family. After consideration of risks, benefits and other options for treatment, the patient has consented to  Procedure(s) with comments: INJECTION, BULKING AGENT, URETHRA (N/A) - BULKAMID SCOPE AND INJECTABLE as a surgical intervention.  The patient's history has been reviewed, patient examined, no change in status, stable for surgery.  I have reviewed the patient's chart and labs.  Questions were answered to the patient's satisfaction.     [name]

## 2023-07-25 NOTE — Transfer of Care (Signed)
 Immediate Anesthesia Transfer of Care Note  Patient: SHALITA NOTTE  Procedure(s) Performed: INJECTION, BULKING AGENT, URETHRA (Urethra)  Patient Location: PACU  Anesthesia Type:MAC  Level of Consciousness: awake, alert , oriented, and patient cooperative  Airway & Oxygen Therapy: Patient Spontanous Breathing  Post-op Assessment: Report given to RN and Post -op Vital signs reviewed and stable  Post vital signs: Reviewed and stable  Last Vitals:  Vitals Value Taken Time  BP 102/76 07/25/23 1559  Temp    Pulse 76 07/25/23 1600  Resp 15 07/25/23 1600  SpO2 95 % 07/25/23 1600  Vitals shown include unfiled device data.  Last Pain:  Vitals:   07/25/23 1329  TempSrc: Oral  PainSc: 2       Patients Stated Pain Goal: 2 (07/25/23 1329)  Complications: No notable events documented.

## 2023-07-25 NOTE — Anesthesia Preprocedure Evaluation (Signed)
 Anesthesia Evaluation  Patient identified by MRN, date of birth, ID band Patient awake    Reviewed: Allergy & Precautions, NPO status , Patient's Chart, lab work & pertinent test results  History of Anesthesia Complications Negative for: history of anesthetic complications  Airway Mallampati: II  TM Distance: >3 FB Neck ROM: Full    Dental no notable dental hx.    Pulmonary asthma    Pulmonary exam normal        Cardiovascular negative cardio ROS Normal cardiovascular exam     Neuro/Psych    Depression    negative neurological ROS     GI/Hepatic Neg liver ROS,GERD  Medicated and Controlled,,  Endo/Other    Renal/GU negative Renal ROS     Musculoskeletal  (+) Arthritis ,    Abdominal   Peds  Hematology negative hematology ROS (+)   Anesthesia Other Findings Day of surgery medications reviewed with patient.  Reproductive/Obstetrics                             Anesthesia Physical Anesthesia Plan  ASA: 2  Anesthesia Plan: MAC   Post-op Pain Management: Tylenol PO (pre-op)*   Induction:   PONV Risk Score and Plan: 2 and Treatment may vary due to age or medical condition, Ondansetron, Propofol infusion and Midazolam  Airway Management Planned: Natural Airway and Simple Face Mask  Additional Equipment:   Intra-op Plan:   Post-operative Plan:   Informed Consent: I have reviewed the patients History and Physical, chart, labs and discussed the procedure including the risks, benefits and alternatives for the proposed anesthesia with the patient or authorized representative who has indicated his/her understanding and acceptance.       Plan Discussed with: CRNA  Anesthesia Plan Comments:        Anesthesia Quick Evaluation

## 2023-07-25 NOTE — Discharge Instructions (Addendum)
 I have reviewed discharge instructions in detail with the patient. They will follow-up with me or their physician as scheduled. My nurse will also be calling the patients as per protocol.     No acetaminophen/Tylenol until after 7:45 pm today if needed.    Post Anesthesia Home Care Instructions  Activity: Get plenty of rest for the remainder of the day. A responsible individual must stay with you for 24 hours following the procedure.  For the next 24 hours, DO NOT: -Drive a car -Advertising copywriter -Drink alcoholic beverages -Take any medication unless instructed by your physician -Make any legal decisions or sign important papers.  Meals: Start with liquid foods such as gelatin or soup. Progress to regular foods as tolerated. Avoid greasy, spicy, heavy foods. If nausea and/or vomiting occur, drink only clear liquids until the nausea and/or vomiting subsides. Call your physician if vomiting continues.  Special Instructions/Symptoms: Your throat may feel dry or sore from the anesthesia or the breathing tube placed in your throat during surgery. If this causes discomfort, gargle with warm salt water. The discomfort should disappear within 24 hours.

## 2023-07-25 NOTE — Op Note (Signed)
 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 Lorin Picket Leslyn Monda  The patient has the above diagnosis and consented the above procedure.  She was on perioperative antibiotics.  Extra care was taken with leg positioning.  I used a Bulkamid scope.  She had a nice straight urethra though I had to have my chair a bit higher.  Good visibility of the urethra.  Bladder mucosa and trigone were normal  I initially used 2 syringes of Bulkamid and injecting at 5:00 and 7:00 and then 2:00 and 10:00.  I was very happy the coaptation of the mid urethra but I felt she could benefit from a little bit more injectable at 2:00.  This was then performed.  I used approximately one third of 1/3 syringe.  Bladder was partially emptied so she could start a trial of voiding.  No bleeding.  Patient was taken to recovery and will be followed as per protocol

## 2024-04-27 ENCOUNTER — Telehealth: Admitting: Nurse Practitioner

## 2024-04-27 DIAGNOSIS — J101 Influenza due to other identified influenza virus with other respiratory manifestations: Secondary | ICD-10-CM

## 2024-04-27 MED ORDER — OSELTAMIVIR PHOSPHATE 75 MG PO CAPS: 75.0000 mg | ORAL_CAPSULE | Freq: Two times a day (BID) | ORAL | 0 refills | Status: AC

## 2024-04-27 NOTE — Progress Notes (Signed)
 E visit for Flu like symptoms   We are sorry that you are not feeling well.  Here is how we plan to help! Based on what you have shared with me it looks like you may have a respiratory virus that may be influenza.  Influenza or the flu is  an infection caused by a respiratory virus. The flu virus is highly contagious and persons who did not receive their yearly flu vaccination may catch the flu from close contact.  We have anti-viral medications to treat the viruses that cause this infection. They are not a cure and only shorten the course of the infection. These prescriptions are most effective when they are given within the first 2 days of flu symptoms. Antiviral medications are indicated if you have a high risk of complications from the flu. You should  also consider an antiviral medication if you are in close contact with someone who is at risk. These medications can help patients avoid complications from the flu but have side effects that you should know.   Possible side effects from Tamiflu  or oseltamivir  include nausea, vomiting, diarrhea, dizziness, headaches, eye redness, sleep problems or other respiratory symptoms. You should not take Tamiflu  if you have an allergy to oseltamivir  or any to the ingredients in Tamiflu .  Based upon your symptoms and potential risk factors I have prescribed Oseltamivir  (Tamiflu ).  It has been sent to your designated pharmacy.  You will take one 75 mg capsule orally twice a day for the next 5 days.   For nasal congestion, you may use an oral decongestant such as Mucinex D or if you have glaucoma or high blood pressure use plain Mucinex.  Saline nasal spray or nasal drops can help and can safely be used as often as needed for congestion.  If you have a sore or scratchy throat, use a saltwater gargle-  to  teaspoon of salt dissolved in a 4-ounce to 8-ounce glass of warm water.  Gargle the solution for approximately 15-30 seconds and then spit.  It is  important not to swallow the solution.  You can also use throat lozenges/cough drops and Chloraseptic spray to help with throat pain or discomfort.  Warm or cold liquids can also be helpful in relieving throat pain.  For headache, pain or general discomfort, you can use Ibuprofen  or Tylenol  as directed.   Some authorities believe that zinc sprays or the use of Echinacea may shorten the course of your symptoms.   You are to isolate at home until you have been fever-free for at least 24 hours without a fever-reducing medication, and symptoms have been steadily improving for 24 hours.  If you must be around other household members who do not have symptoms, you need to make sure that both you and the family members are masking consistently with a high-quality mask.  If you note any worsening of symptoms despite treatment, please seek an in-person evaluation ASAP. If you note any significant shortness of breath or any chest pain, please seek ED evaluation. Please do not delay care!  ANYONE WHO HAS FLU SYMPTOMS SHOULD: Stay home. The flu is highly contagious and going out or to work exposes others! Be sure to drink plenty of fluids. Water is fine as well as fruit juices, sodas and electrolyte beverages. You may want to stay away from caffeine or alcohol. If you are nauseated, try taking small sips of liquids. How do you know if you are getting enough fluid? Your urine should be  a pale yellow or almost colorless. Get rest. Taking a steamy shower or using a humidifier may help nasal congestion and ease sore throat pain. Using a saline nasal spray works much the same way. Cough drops, hard candies and sore throat lozenges may ease your cough. Line up a caregiver. Have someone check on you regularly.  GET HELP RIGHT AWAY IF: You cannot keep down liquids or your medications. You become short of breath Your fell like you are going to pass out or loose consciousness. Your symptoms persist after you have  completed your treatment plan  MAKE SURE YOU  Understand these instructions. Will watch your condition. Will get help right away if you are not doing well or get worse.  Your e-visit answers were reviewed by a board certified advanced clinical practitioner to complete your personal care plan.  Depending on the condition, your plan could have included both over the counter or prescription medications.  If there is a problem please reply  once you have received a response from your provider.  Your safety is important to us .  If you have drug allergies check your prescription carefully.    You can use MyChart to ask questions about todays visit, request a non-urgent call back, or ask for a work or school excuse for 24 hours related to this e-Visit. If it has been greater than 24 hours you will need to follow up with your provider, or enter a new e-Visit to address those concerns.  You will get an e-mail in the next two days asking about your experience.  I hope that your e-visit has been valuable and will speed your recovery. Thank you for using e-visits.   I have spent 5 minutes in review of e-visit questionnaire, review and updating patient chart, medical decision making and response to patient.   Nayib Remer W Rhet Rorke, NP
